# Patient Record
Sex: Male | Born: 1935 | Race: Black or African American | Hispanic: No
Health system: Southern US, Community
[De-identification: ages and names within clinical notes are randomized; demographics above are authoritative.]

---

## 2005-09-20 ENCOUNTER — Ambulatory Visit: Payer: Self-pay | Admitting: Family Medicine

## 2016-07-10 ENCOUNTER — Encounter (HOSPITAL_COMMUNITY): Payer: Self-pay | Admitting: Emergency Medicine

## 2016-07-10 ENCOUNTER — Emergency Department (HOSPITAL_COMMUNITY)
Admission: EM | Admit: 2016-07-10 | Discharge: 2016-07-10 | Disposition: A | Discharge status: Home / Self Care | Payer: Medicare Other | Attending: Emergency Medicine | Admitting: Emergency Medicine

## 2016-07-10 ENCOUNTER — Emergency Department (HOSPITAL_COMMUNITY): Payer: Medicare Other

## 2016-07-10 DIAGNOSIS — F0390 Unspecified dementia without behavioral disturbance: Secondary | ICD-10-CM | POA: Insufficient documentation

## 2016-07-10 DIAGNOSIS — R4182 Altered mental status, unspecified: Secondary | ICD-10-CM | POA: Diagnosis present

## 2016-07-10 LAB — COMPREHENSIVE METABOLIC PANEL
ALBUMIN: 4.5 g/dL (ref 3.5–5.0)
ALT: 15 U/L — AB (ref 17–63)
AST: 29 U/L (ref 15–41)
Alkaline Phosphatase: 50 U/L (ref 38–126)
Anion gap: 11 (ref 5–15)
BUN: 14 mg/dL (ref 6–20)
CHLORIDE: 99 mmol/L — AB (ref 101–111)
CO2: 26 mmol/L (ref 22–32)
CREATININE: 0.99 mg/dL (ref 0.61–1.24)
Calcium: 9.3 mg/dL (ref 8.9–10.3)
GFR calc Af Amer: >60 mL/min (ref >60)
GLUCOSE: 91 mg/dL (ref 65–99)
POTASSIUM: 3.9 mmol/L (ref 3.5–5.1)
Sodium: 136 mmol/L (ref 135–145)
Total Bilirubin: 1.9 mg/dL — ABNORMAL HIGH (ref 0.3–1.2)
Total Protein: 8 g/dL (ref 6.5–8.1)

## 2016-07-10 LAB — CBC WITH DIFFERENTIAL/PLATELET
BASOS ABS: 0 10*3/uL (ref 0.0–0.1)
BASOS PCT: 0 %
EOS PCT: 1 %
Eosinophils Absolute: 0 10*3/uL (ref 0.0–0.7)
HCT: 33.8 % — ABNORMAL LOW (ref 39.0–52.0)
Hemoglobin: 11.5 g/dL — ABNORMAL LOW (ref 13.0–17.0)
LYMPHS PCT: 20 %
Lymphs Abs: 0.9 10*3/uL (ref 0.7–4.0)
MCH: 30.7 pg (ref 26.0–34.0)
MCHC: 34 g/dL (ref 30.0–36.0)
MCV: 90.1 fL (ref 78.0–100.0)
Monocytes Absolute: 0.4 10*3/uL (ref 0.1–1.0)
Monocytes Relative: 9 %
NEUTROS ABS: 3 10*3/uL (ref 1.7–7.7)
Neutrophils Relative %: 70 %
PLATELETS: 316 10*3/uL (ref 150–400)
RBC: 3.75 MIL/uL — AB (ref 4.22–5.81)
RDW: 13.2 % (ref 11.5–15.5)
WBC: 4.2 10*3/uL (ref 4.0–10.5)

## 2016-07-10 LAB — URINALYSIS, ROUTINE W REFLEX MICROSCOPIC
BILIRUBIN URINE: NEGATIVE
Glucose, UA: NEGATIVE mg/dL
HGB URINE DIPSTICK: NEGATIVE
Ketones, ur: 5 mg/dL — AB
Leukocytes, UA: NEGATIVE
Nitrite: NEGATIVE
PH: 7 (ref 5.0–8.0)
Protein, ur: NEGATIVE mg/dL
SPECIFIC GRAVITY, URINE: 1.013 (ref 1.005–1.030)

## 2016-07-10 LAB — CBG MONITORING, ED: GLUCOSE-CAPILLARY: 85 mg/dL (ref 65–99)

## 2016-07-10 LAB — AMMONIA: Ammonia: 18 umol/L (ref 9–35)

## 2016-07-10 LAB — I-STAT CG4 LACTIC ACID, ED: Lactic Acid, Venous: 1.29 mmol/L (ref 0.5–1.9)

## 2016-07-10 LAB — I-STAT TROPONIN, ED: Troponin i, poc: 0 ng/mL (ref 0.00–0.08)
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
## 2016-07-10 NOTE — ED Notes (Signed)
Per [employer], patient truck is located at [facility] [employer].
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
## 2016-07-10 NOTE — ED Notes (Signed)
Per [employer], Pt reports that he has a son: [name] [phone].  However, they do not believe the number is working.

## 2019-04-07 ENCOUNTER — Other Ambulatory Visit: Payer: Self-pay

## 2019-04-07 ENCOUNTER — Emergency Department: Payer: Medicare Other

## 2019-04-07 ENCOUNTER — Emergency Department
Admission: EM | Admit: 2019-04-07 | Discharge: 2019-04-11 | Disposition: A | Discharge status: Home / Self Care | Payer: Medicare Other | Attending: Emergency Medicine | Admitting: Emergency Medicine

## 2019-04-07 DIAGNOSIS — S01111A Laceration without foreign body of right eyelid and periocular area, initial encounter: Secondary | ICD-10-CM | POA: Diagnosis not present

## 2019-04-07 DIAGNOSIS — Z23 Encounter for immunization: Secondary | ICD-10-CM | POA: Diagnosis not present

## 2019-04-07 DIAGNOSIS — E860 Dehydration: Secondary | ICD-10-CM | POA: Diagnosis not present

## 2019-04-07 DIAGNOSIS — W19XXXA Unspecified fall, initial encounter: Secondary | ICD-10-CM

## 2019-04-07 DIAGNOSIS — Y9389 Activity, other specified: Secondary | ICD-10-CM | POA: Insufficient documentation

## 2019-04-07 DIAGNOSIS — Y92017 Garden or yard in single-family (private) house as the place of occurrence of the external cause: Secondary | ICD-10-CM | POA: Insufficient documentation

## 2019-04-07 DIAGNOSIS — F0390 Unspecified dementia without behavioral disturbance: Secondary | ICD-10-CM | POA: Diagnosis not present

## 2019-04-07 DIAGNOSIS — Y998 Other external cause status: Secondary | ICD-10-CM | POA: Insufficient documentation

## 2019-04-07 DIAGNOSIS — S0990XA Unspecified injury of head, initial encounter: Secondary | ICD-10-CM | POA: Diagnosis present

## 2019-04-07 LAB — CBC WITH DIFFERENTIAL/PLATELET
Abs Immature Granulocytes: 0.03 10*3/uL (ref 0.00–0.07)
Basophils Absolute: 0 10*3/uL (ref 0.0–0.1)
Basophils Relative: 0 %
Eosinophils Absolute: 0 10*3/uL (ref 0.0–0.5)
Eosinophils Relative: 1 %
HCT: 32.6 % — ABNORMAL LOW (ref 39.0–52.0)
Hemoglobin: 10.7 g/dL — ABNORMAL LOW (ref 13.0–17.0)
Immature Granulocytes: 1 %
Lymphocytes Relative: 22 %
Lymphs Abs: 1.3 10*3/uL (ref 0.7–4.0)
MCH: 30.3 pg (ref 26.0–34.0)
MCHC: 32.8 g/dL (ref 30.0–36.0)
MCV: 92.4 fL (ref 80.0–100.0)
Monocytes Absolute: 0.6 10*3/uL (ref 0.1–1.0)
Monocytes Relative: 11 %
Neutro Abs: 3.9 10*3/uL (ref 1.7–7.7)
Neutrophils Relative %: 65 %
Platelets: 333 10*3/uL (ref 150–400)
RBC: 3.53 MIL/uL — ABNORMAL LOW (ref 4.22–5.81)
RDW: 14.2 % (ref 11.5–15.5)
WBC: 5.8 10*3/uL (ref 4.0–10.5)
nRBC: 0 % (ref 0.0–0.2)

## 2019-04-07 LAB — BASIC METABOLIC PANEL
Anion gap: 11 (ref 5–15)
Anion gap: 9 (ref 5–15)
BUN: 33 mg/dL — ABNORMAL HIGH (ref 8–23)
BUN: 32 mg/dL — ABNORMAL HIGH (ref 8–23)
CO2: 22 mmol/L (ref 22–32)
CO2: 23 mmol/L (ref 22–32)
Calcium: 9.4 mg/dL (ref 8.9–10.3)
Calcium: 9.3 mg/dL (ref 8.9–10.3)
Chloride: 104 mmol/L (ref 98–111)
Chloride: 107 mmol/L (ref 98–111)
Creatinine, Ser: 1.69 mg/dL — ABNORMAL HIGH (ref 0.61–1.24)
Creatinine, Ser: 1.56 mg/dL — ABNORMAL HIGH (ref 0.61–1.24)
GFR calc Af Amer: 43 mL/min — ABNORMAL LOW (ref >60)
GFR calc Af Amer: 47 mL/min — ABNORMAL LOW (ref >60)
GFR calc non Af Amer: 37 mL/min — ABNORMAL LOW (ref >60)
GFR calc non Af Amer: 40 mL/min — ABNORMAL LOW (ref >60)
Glucose, Bld: 133 mg/dL — ABNORMAL HIGH (ref 70–99)
Glucose, Bld: 122 mg/dL — ABNORMAL HIGH (ref 70–99)
Potassium: 5.2 mmol/L — ABNORMAL HIGH (ref 3.5–5.1)
Potassium: 4.9 mmol/L (ref 3.5–5.1)
Sodium: 137 mmol/L (ref 135–145)
Sodium: 139 mmol/L (ref 135–145)

## 2019-04-07 LAB — TROPONIN I (HIGH SENSITIVITY)
Troponin I (High Sensitivity): 6 ng/L (ref <18)
Troponin I (High Sensitivity): 6 ng/L (ref <18)

## 2019-04-07 MED ORDER — QUETIAPINE FUMARATE 25 MG PO TABS
50.0000 mg | ORAL_TABLET | Freq: Every day | ORAL | Status: DC
  Administered 2019-04-09 – 2019-04-10 (×2): 50 mg via ORAL
  Filled 2019-04-07 (×5): qty 2

## 2019-04-07 MED ORDER — TETANUS-DIPHTH-ACELL PERTUSSIS 5-2.5-18.5 LF-MCG/0.5 IM SUSP
0.5000 mL | Freq: Once | INTRAMUSCULAR | Status: AC
  Administered 2019-04-07: 0.5 mL via INTRAMUSCULAR
  Filled 2019-04-07: qty 0.5

## 2019-04-07 MED ORDER — SODIUM CHLORIDE 0.9 % IV BOLUS
1000.0000 mL | Freq: Once | INTRAVENOUS | Status: AC
Start: 2019-04-07 — End: 2019-04-07
  Administered 2019-04-07: 1000 mL via INTRAVENOUS

## 2019-04-07 MED ORDER — OLANZAPINE 10 MG IM SOLR
5.0000 mg | Freq: Once | INTRAMUSCULAR | Status: AC
  Administered 2019-04-07: 5 mg via INTRAMUSCULAR
  Filled 2019-04-07 (×2): qty 10

## 2019-04-07 NOTE — ED Notes (Signed)
Pt to see social work in the morning. NT to sit with pt d/t being a high fall risk and his AMS.

## 2019-04-07 NOTE — ED Notes (Signed)
This RN was called by Caitlyn, NT due to pt not attempting to leave his room. Pt told NT that he had to use the restroom but then when he got to the toilet said that he did not have to do anything. Pt then tried to leave the room but was stopped by NT. Pt then urinated on the floor and himself. Pt refused to change pants but allowed this RN to change his socks since they were wet. Pt placed back in bed with sitter at bedside. EDP made aware.

## 2019-04-07 NOTE — ED Notes (Signed)
Assisted Dr. Cinda Quest with applying dermabond to pt's R eyebrow
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
## 2019-04-07 NOTE — ED Notes (Signed)
Pt's son is at the bedside at this time, helping to redirect the patient to stay in the bed.

## 2019-04-07 NOTE — ED Notes (Signed)
Clean pt's wound on R eyebrow
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
## 2019-04-07 NOTE — ED Notes (Addendum)
This RN entered the pt's room, pt was standing up, pacing around the room. He pulled his IV out and his NS bolus was spilled onto the floor. Pt also disconnected his BP cuff, pulse ox, and cardiac leads. It appears that the pt urinated on hisself. Pt changed into clean blue paper scrubs and clean yellow socks. Pt was hard to redirect. Dr. Cinda Quest made aware. Dr. Cinda Quest instructed to straight stick for the lab work that was ordered.

## 2019-04-07 NOTE — ED Provider Notes (Signed)
Received patient signout from Dr. Rip Harbour.  Patient was pending psychiatry consult, nurse practitioner and psychiatry team have evaluated.  Patient became more agitated, urinating on floor, trying to get out of bed requiring staff frequent redirection and assistance back to bed.  Psychiatry team reevaluated, ordered medication for call me.  Psychiatry team plans to continue to follow patient into the morning as we also planned a social work consult.   Delman Kitten, MD 04/07/19 2248

## 2019-04-07 NOTE — ED Provider Notes (Signed)
Ongoing care including follow-up on psychiatry recommendations and reassessment as well as social work consult assigned to Dr. Beather Arbour.   Delman Kitten, MD 04/07/19 [phone]

## 2019-04-07 NOTE — Discharge Instructions (Addendum)
Please keep the cuts clean and dry.  Do not apply ointment on the eyebrow cut as that we will remove the skin glue that I used.  Please encourage plenty of liquids by mouth as he was a little dry today.  Please return for any further problems and follow-up with his doctor within the week to check and see how the cuts are doing.  Please return for any signs of infection including increased swelling, redness, pus or pain.
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
## 2019-04-07 NOTE — ED Notes (Signed)
Son states that pt may have fell due to his hip having trouble and "locking up on him" in the past.

## 2019-04-07 NOTE — ED Notes (Signed)
Pt has a son at bedside and the son is not assisting with the safety of the patient - he is allowing him to urinate in bed and on floor and ambulate around the room (unsteady gait) - Apolonio Schneiders RN went to room to answer call bell and found this things to be happening and attempted to change the pt/dry him - pt became agitated and aggressive with Apolonio Schneiders RN requiring assistance to change

## 2019-04-08 LAB — URINALYSIS, COMPLETE (UACMP) WITH MICROSCOPIC
Bacteria, UA: NONE SEEN
Bilirubin Urine: NEGATIVE
Glucose, UA: 50 mg/dL — AB
Hgb urine dipstick: NEGATIVE
Ketones, ur: NEGATIVE mg/dL
Leukocytes,Ua: NEGATIVE
Nitrite: NEGATIVE
Protein, ur: NEGATIVE mg/dL
Specific Gravity, Urine: 1.012 (ref 1.005–1.030)
WBC, UA: NONE SEEN WBC/hpf (ref 0–5)
pH: 6 (ref 5.0–8.0)

## 2019-04-08 MED ORDER — ZIPRASIDONE MESYLATE 20 MG IM SOLR
10.0000 mg | Freq: Once | INTRAMUSCULAR | Status: AC
  Administered 2019-04-08: 10 mg via INTRAMUSCULAR
  Filled 2019-04-08: qty 20

## 2019-04-08 NOTE — ED Notes (Signed)
Pt awake taken to bathroom. Pt helped back to bed. Pt appears to be more alert at this time answering simple questions. Pt eating meal tray.

## 2019-04-08 NOTE — TOC Initial Note (Signed)
Transition of Care [facility]) - Initial/Assessment Note    Patient Details  Name: Jeremy Downs MRN: [id number] Date of Birth: 1934-06-03  Transition of Care [place]) CM/SW Contact:    Fredric Mare, LCSW Phone Number: 04/08/2019, 3:52 PM  Clinical Narrative:                  Patient is an 85 year old male with dementia that presents to the ED for a fall. A bystander saw patient sitting on the porch of his home with an abrasion on his head.  Patient's daughter, Vaughan Basta, was at bedside. Vaughan Basta stated that she is considering either home health or long term care for this patient. CSW and Vaughan Basta discussed these options thoroughly. Vaughan Basta agreed to home health, and understood that a Education officer, museum can assist with transitioning to long term care and helping patient obtain special assistance Medicaid. CSW provided [place] with information for [facility].  Vaughan Basta chose [employer]. CSW contacted Sharmon Revere, and notified EDP to put in home health orders for PT, RN, SW, and nurse aide.      Expected Discharge Plan: [place]     Patient Goals and CMS Choice     Choice offered to / list presented to : [facility] POA / Guardian  Expected Discharge Plan and Services Expected Discharge Plan: [place]   Discharge Planning Services: CM Consult Post Acute Care Choice: [place] arrangements for the past 2 months: Single Family Home                           HH Arranged: PT, Social Work, Nurse's Aide, RN [place] Agency: [place] Date [place]: 04/08/19 Time HH Agency Contacted: 80 Representative spoke with at [place]: Sharmon Revere  Prior Living Arrangements/Services Living arrangements for the past 2 months: [place] with:: Siblings Patient language and need for interpreter reviewed:: Yes Do you feel safe going back to the place where you live?: Yes      Need for Family Participation in Patient Care: Yes (Comment) Care  giver support system in place?: Yes (comment)   Criminal Activity/Legal Involvement Pertinent to Current Situation/Hospitalization: No - Comment as needed  Activities of Daily Living      Permission Sought/Granted                  Emotional Assessment Appearance:: Appears stated age   Affect (typically observed): Calm, Appropriate Orientation: : Oriented to Self Alcohol / Substance Use: Never Used Psych Involvement: No (comment)  Admission diagnosis:  dementia Patient Active Problem List   Diagnosis Date Noted  . Dementia ([place]) 04/07/2019   PCP:  Patient, No Pcp Per Pharmacy:  No Pharmacies Listed    Social Determinants of Health (SDOH) Interventions    Readmission Risk Interventions No flowsheet data found.

## 2019-04-08 NOTE — ED Notes (Signed)
Pt given meal tray at this time. Pt ate about 50% of tray.

## 2019-04-08 NOTE — ED Notes (Signed)
Pt keeps trying to get up multiple times. Family and this EDT try to redirect him multiple times but not having a lot of success. Pt states he is going home.

## 2019-04-08 NOTE — ED Notes (Signed)
Breakfast tray placed in patient's room. Patient resting in bed with eyes closed, even, unlabored respirations. Safety sitter remains at bedside.

## 2019-04-08 NOTE — ED Notes (Signed)
Pt offered meal tray. Pt refused stating he was not hunger. Will offer again at a later time.

## 2019-04-08 NOTE — BH Assessment (Signed)
Writer called and left a HIPPA Compliant message with daughter Vaughan Basta Parker-[phone]), requesting a return phone call.

## 2019-04-08 NOTE — ED Notes (Signed)
Pt offered meal tray by family member. Pt ate 25% of tray.

## 2019-04-08 NOTE — Social Work (Signed)
CSW aware of consult; awaiting reassessment by psychiatrist.     Fredric Mare, [place] [facility]  [phone]

## 2019-04-08 NOTE — ED Notes (Signed)
Pt up at this time and helped to bathroom by this EDT. Bed linen changed and pt cleaned up from being wet. Pt helped back to bed.

## 2019-04-08 NOTE — ED Notes (Signed)
Family arrived to bedside

## 2019-04-08 NOTE — ED Provider Notes (Signed)
Procedures     ----------------------------------------- 6:44 PM on 04/08/2019 -----------------------------------------   SW has set up home health PT, HHA, RN, SW services.  Medically stable for DC home.    Carrie Mew, MD 04/08/19 [phone]

## 2019-04-08 NOTE — ED Notes (Signed)
Constant re-guidance of pt to lay down has been given. At this time pt is calm and resting. Linens changed. Pt cleaned. Brief, scrub pants and new non-slip socks placed on patient. This sitter remains at bedside.

## 2019-04-08 NOTE — ED Notes (Signed)
Pt has been getting out of bed, unable to redirect. Attempting to push staff out of the way and leave. Sitter remains at bedside. Nonskid socks remain in place

## 2019-04-08 NOTE — ED Notes (Signed)
Pt offered meal tray again continues to state he does not want it.

## 2019-04-08 NOTE — ED Notes (Signed)
Patient given lunch tray. Sitter remains at bedside with patient.
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
## 2019-04-08 NOTE — ED Provider Notes (Signed)
-----------------------------------------   6:11 AM on 04/08/2019 -----------------------------------------   Blood pressure (!) 165/96, pulse 70, temperature (!) 97.4 F (36.3 C), temperature source Oral, resp. rate 11, height 5\' 10"  (1.778 m), weight 55 kg, SpO2 100 %.  The patient is calm and cooperative at this time.  There have been no acute events since the last update.  Awaiting disposition plan from Behavioral Medicine and/or Social Work team(s).   Paulette Blanch, MD 04/08/19 [phone]

## 2019-04-09 MED ORDER — ENSURE ENLIVE PO LIQD
237.0000 mL | Freq: Two times a day (BID) | ORAL | Status: DC
  Administered 2019-04-10: 12:00:00 237 mL via ORAL

## 2019-04-09 MED ORDER — ZIPRASIDONE MESYLATE 20 MG IM SOLR
10.0000 mg | Freq: Once | INTRAMUSCULAR | Status: AC
  Administered 2019-04-09: 10 mg via INTRAMUSCULAR
  Filled 2019-04-09: qty 20

## 2019-04-09 MED ORDER — HALOPERIDOL LACTATE 5 MG/ML IJ SOLN: 5.0000 mg | Freq: Once | INTRAMUSCULAR | Status: DC

## 2019-04-09 NOTE — ED Provider Notes (Signed)
No acute issues during this shift.  Patient awaiting final disposition via social work team connecting with plans for home health care.  Ongoing care assigned to Dr. Rodena Piety, MD 04/09/19 [phone]

## 2019-04-09 NOTE — ED Notes (Signed)
Report from annie, rn. Sitter at bedside. resps unlabored. Pt resting in no acute distress.

## 2019-04-09 NOTE — ED Notes (Signed)
PT resting comfortably with even, snoring respirations

## 2019-04-09 NOTE — ED Provider Notes (Signed)
-----------------------------------------   6:28 AM on 04/09/2019 -----------------------------------------   Blood pressure (!) 151/74, pulse 76, temperature 98.1 F (36.7 C), temperature source Oral, resp. rate 18, height 5\' 10"  (1.778 m), weight 55 kg, SpO2 94 %.  The patient is calm and cooperative at this time.  Currently awaiting home health establishment for family to take the patient home.   Harvest Dark, MD 04/09/19 [phone]

## 2019-04-09 NOTE — ED Notes (Signed)
Pt provided dinner tray.  Family at bedside at this time.  Safety sitter remains at bedside.

## 2019-04-09 NOTE — ED Notes (Signed)
PT awake and trying to get out of bed again. Assisted tech to change pt's brief and place pt in new brief. PT back in bed.

## 2019-04-10 MED ORDER — DROPERIDOL 2.5 MG/ML IJ SOLN: 5.0000 mg | Freq: Once | INTRAMUSCULAR | Status: DC

## 2019-04-10 MED ORDER — DROPERIDOL 2.5 MG/ML IJ SOLN
2.5000 mg | Freq: Once | INTRAMUSCULAR | Status: AC
  Administered 2019-04-10: 03:00:00 2.5 mg via INTRAMUSCULAR
  Filled 2019-04-10: qty 2

## 2019-04-10 NOTE — ED Notes (Signed)
Pt calmer, sitter remains at bedside.

## 2019-04-10 NOTE — ED Notes (Signed)
Pt awake, sitter at bedside. Pt declines offer for po fluids or restroom.

## 2019-04-10 NOTE — ED Notes (Signed)
Pt. Woke up and tried to jump out of bed. Attempted to calm pt. Down and asked him if he needed to use the bathroom. Pt. Nodded but did nothing when sat on toilet. Pt. Then stood up and paced around the room, pt. Urinated on themselves. Cleaned pt. Up and asked if they wanted to go to bed or on the chair. Pt. Said "bed". Writer then directed pt. To the bed and pt. Is now calm and attempting to fall asleep once again. Will continue to monitor pt.

## 2019-04-10 NOTE — ED Notes (Signed)
Pts son at bedside with patient and sitter. Pt sitting in bed in NAD with evan and unlabored respirations. Son denies having further questions at this time.

## 2019-04-10 NOTE — ED Notes (Signed)
Pt sitting up on side of bed, sitter at side. resps unlabored.

## 2019-04-10 NOTE — ED Notes (Signed)
Pt will not stay in bed or chair for safety. Pt has not slept this pm and continues to refuse PO fluids. MD notified.

## 2019-04-10 NOTE — Social Work (Signed)
TOC CM/SW follow-up with family.   Social worker called daughter, Twanna Hy, [phone], No answer. Left HIPAA complaint message for Mrs. Parker to call [facility].   Berenice Bouton, MSW, LCSW  [phone] 8am-6pm (weekends) or CSW ED # [phone]

## 2019-04-10 NOTE — ED Notes (Signed)
Pt. Had been asleep since 0745, pt. Woke up asking to use the BR. Brought a urinal to pt., but pt. Had already urinated on themselves. Washed up pt., as well as changed linens and clothes. Pt. Is currently eating lunch. Will continue to monitor pt.

## 2019-04-10 NOTE — ED Notes (Signed)
Pt remains awake, talking to staff at bedside.

## 2019-04-10 NOTE — ED Notes (Signed)
Pt resting quietly on ER stretcher, pt has entire body covered with blanket.  When staff attempts to uncover head to check on pt, he immediately recovers head.  Safety sitter remains at bedside, breakfast tray left at bedside.  Will continue to monitor pt closely.

## 2019-04-10 NOTE — ED Notes (Signed)
Pt ambulatory in room, safety sitter within arms reach.  Pt unable to cooperate with VS at this time.  Dr. Jari Pigg aware of pt condition, PRN medication ordered if needed.  Will follow up with SW this AM to confirm POC.  Will continue to monitor.

## 2019-04-10 NOTE — ED Provider Notes (Signed)
-----------------------------------------   3:16 AM on 04/10/2019 -----------------------------------------   Blood pressure (!) 125/92, pulse 66, temperature (!) 97.5 F (36.4 C), temperature source Axillary, resp. rate 16, height 1.778 m (5\' 10" ), weight 55 kg, SpO2 100 %.  The patient is unable to sleep, trying to get up frequently, requires frequent redirection by his sitter.  He seems to be suffering from insomnia as well as some sundowning and he is a fall risk.  To help him with his level of comfort I am ordering as a calming agent a dose of droperidol 2.5 mg intramuscular.  The patient does not tolerate having an IV for an extended period of time and he should tolerate intramuscular well.  He has not been eating or drinking very much so if he is able to get some rest with the intramuscular droperidol, we may consider placing an IV and giving him a small fluid bolus but will reassess once he is more calm.   ----------------------------------------- 6:34 AM on 04/10/2019 -----------------------------------------  Patient is still agitated but more redirectable than before.  No acute distress.  Awaiting placement.   Hinda Kehr, MD 04/10/19 [phone]

## 2019-04-10 NOTE — ED Notes (Signed)
Sitter up out of bed constantly, ambulating around room. Sitter remains at side to prevent fall.

## 2019-04-10 NOTE — ED Notes (Signed)
Report to jennifer, rn.  

## 2019-04-10 NOTE — ED Notes (Signed)
Pt sitting in bedside chair folding bed pad. Pt In NAD and calm and cooperative with staff.

## 2019-04-10 NOTE — ED Notes (Signed)
Candida Peeling - Niece - [phone] Called to check on patient. She has verbalized concern for home health and questioned the safety of pt returning home. She is hoping to talk to someone about placing pt in an assisted living. Message left for social work and RN has encouraged Anne Ng to talk to her family in regards to their wishes. Anne Ng also reported this this RN that she questions whether decisions have been made by her family to keep patient at home because of safety or because of pts "checks."

## 2019-04-10 NOTE — ED Notes (Signed)
Pt up out of bed, urinating on floor and wall. Pt assisted with cleansing, new pants provided, new socks provided.

## 2019-04-10 NOTE — ED Notes (Signed)
Pt remains awake. Sitter at side. resps unlabored. Pt refusing to wear underpants, but pants in place.

## 2019-04-10 NOTE — ED Notes (Signed)
Pt less agitated, sitter remains at bedside. Bed in low and locked position, siderails up.

## 2019-04-10 NOTE — ED Notes (Signed)
Pt up to commode, small urine void.

## 2019-04-10 NOTE — ED Notes (Signed)
This RN into room to relieve sitter for break.

## 2019-04-10 NOTE — ED Notes (Signed)
Cannot keep pt still long enough to obtain a blood pressure or pox. Manual hr 100. Pt eating ice cream with assist of nurse.

## 2019-04-10 NOTE — ED Notes (Signed)
Pt ate one cup of vanilla ice cream with assist to eat. Pt placed in paper pants for comfort, will not wear brief or gown/shirt. Sitter back in room from break.

## 2019-04-11 NOTE — ED Notes (Signed)
Pt sitting up in bed, sitter at bedside. No distress noted.

## 2019-04-11 NOTE — ED Notes (Signed)
Per sitter pt with one incidence of urination in commode this am.

## 2019-04-11 NOTE — Social Work (Addendum)
TOC CM/SW made contact with the patient's daughter, Twanna Hy, [phone].  Mrs. Jerline Pain stated that she is going to pick-up the patient today Sunday, 04/11/2019 at 11:00 AM. Has HH in place and DME. Stated that she has been in contact with [facility] for long term care.  [facility] assisting family with medicaid applications and necessary paper work that is needed for placement and payments.   Plan: Discharge home with family and [place] on 04/11/2019.    Berenice Bouton, MSW, LCSW  [phone] 8am-6pm (weekends) or CSW ED # [phone]

## 2019-04-11 NOTE — ED Notes (Signed)
Pt sitting up on side of bed, sitter at bedside. Pt has been independently ambulating tonight with sitter within arms reach.

## 2019-04-11 NOTE — ED Notes (Signed)
Pt sleeping, sitter at bedside

## 2019-04-11 NOTE — ED Notes (Signed)
Pt remains awake, sitting in chair with sitter at side.

## 2019-04-11 NOTE — ED Provider Notes (Signed)
-----------------------------------------   7:06 AM on 04/11/2019 -----------------------------------------   Blood pressure (P) 120/73, pulse 100, temperature 98.2 F (36.8 C), temperature source Oral, resp. rate 20, height 5\' 10"  (1.778 m), weight 55 kg, SpO2 100 %.  The patient is calm and cooperative at this time.  There have been no acute events since the last update.  Awaiting disposition plan from Social Work team(s).   Arta Silence, MD 04/11/19 [phone]

## 2019-04-11 NOTE — ED Notes (Addendum)
Pt discharged with son. Pt's son verbalized understanding of discharge instructions and signed signature pad acknowledging understanding. Pt in NAD at this time.

## 2019-04-11 NOTE — ED Notes (Signed)
Pt awake, sitting up on side of bed with sitter at bedside.

## 2019-04-11 NOTE — ED Notes (Signed)
Report to kim, rn.  

## 2019-04-11 NOTE — ED Notes (Signed)
Pt consumed one cup of ice cream with sips of juice.

## 2019-04-11 NOTE — ED Provider Notes (Signed)
-----------------------------------------   10:46 AM on 04/11/2019 -----------------------------------------  Per social work, the family will pick up the patient today around 69.  The patient is stable for discharge home.  Return precautions and discharge instructions provided.   Arta Silence, MD 04/11/19 1046

## 2019-04-11 NOTE — ED Notes (Signed)
Pt resting in bed, sitter at side.

## 2019-04-11 NOTE — ED Notes (Signed)
Pt sleeping. 

## 2019-12-07 DEATH — deceased

## 2020-02-23 IMAGING — CT CT CERVICAL SPINE W/O CM
3 of 4 series · 12 of 33 positions shown, 14 images · non-contrast
Comparison: None.

CLINICAL DATA: Neck pain. Status post fall with a head contusion.
Initial encounter.

EXAM:
CT CERVICAL SPINE WITHOUT CONTRAST
TECHNIQUE: Multidetector CT imaging of the cervical spine was performed without
intravenous contrast. Multiplanar CT image reconstructions were also
generated.

[Series 4: sagittal bone · sagittal · 0.30mm/px · 5 of 85 slices shown, 6 images]
[im 29/85  bone]
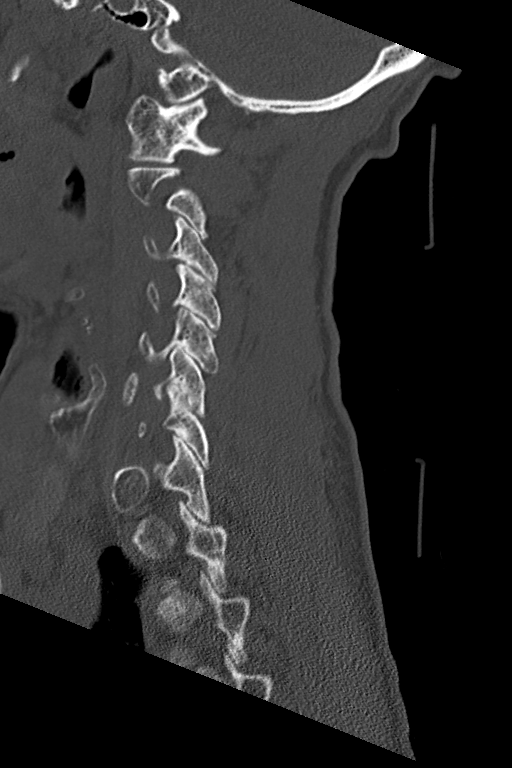
[im 36/85  bone]
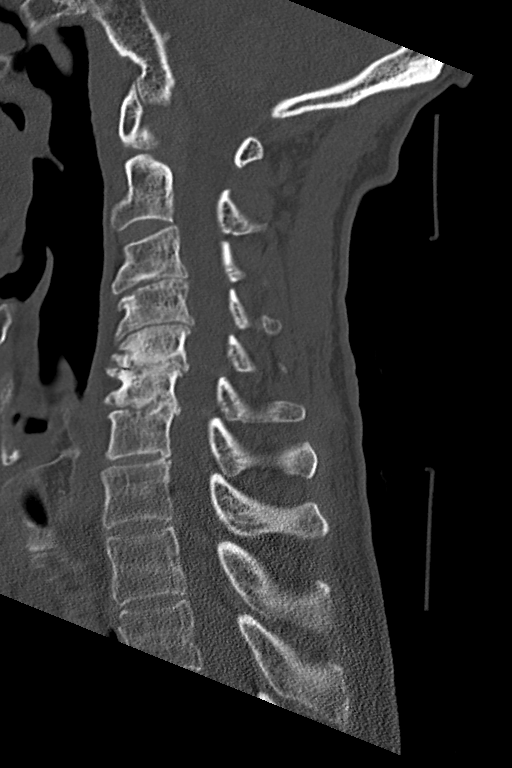
[im 43/85  soft-tissue]
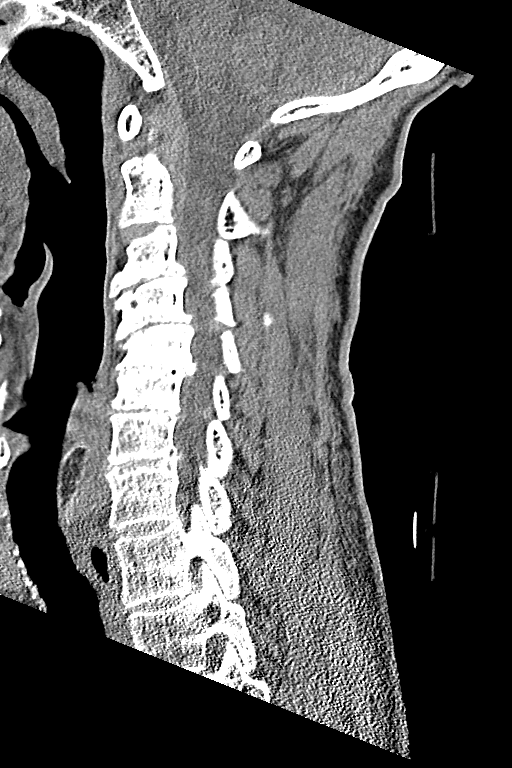
[im 43/85  bone]
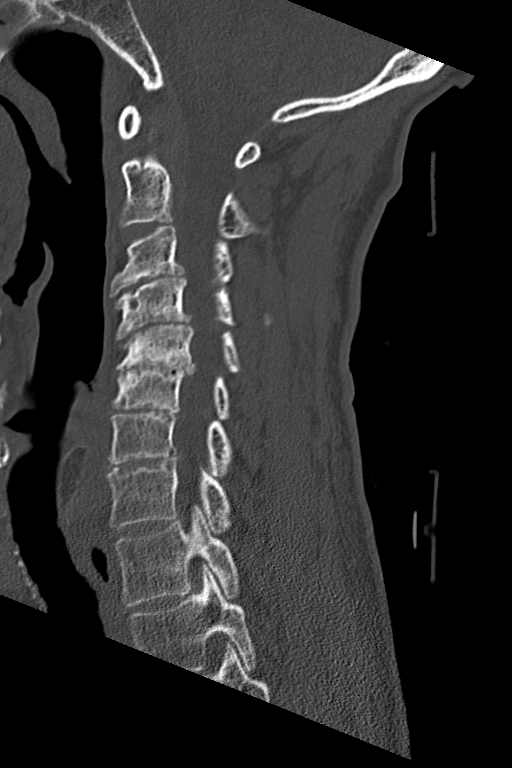
[im 50/85  bone]
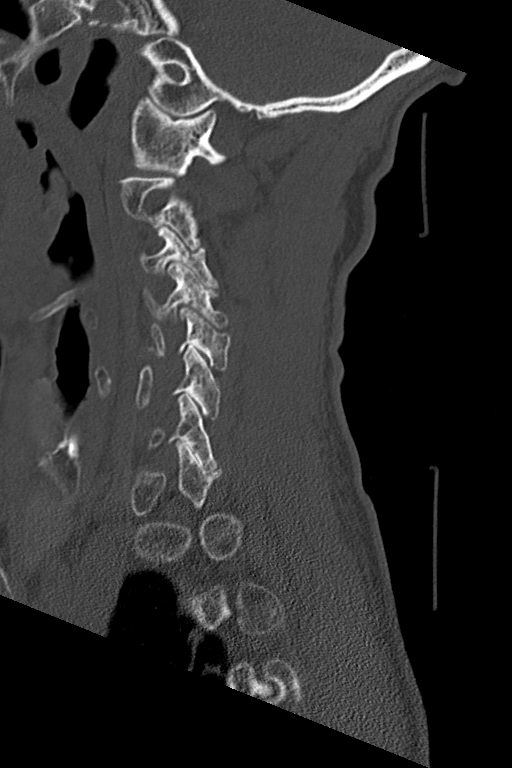
[im 57/85  bone]
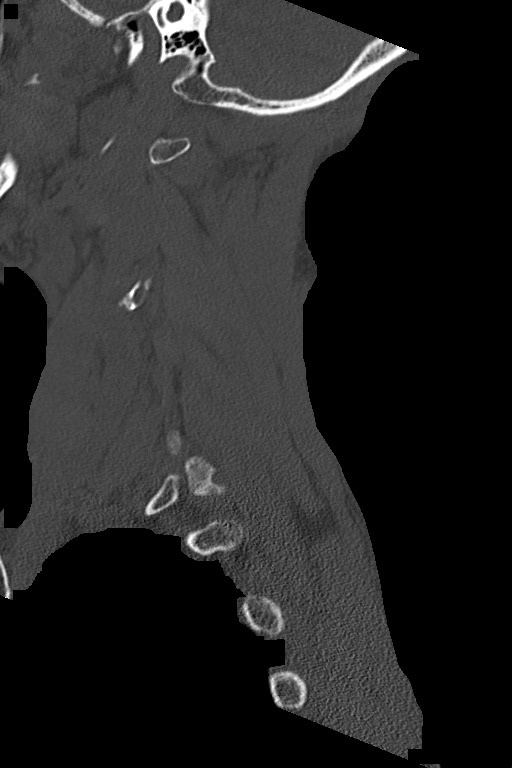

[Series 5: coronal bone · coronal · 0.27mm/px · 3 of 62 slices shown]
[im 13/62  bone]
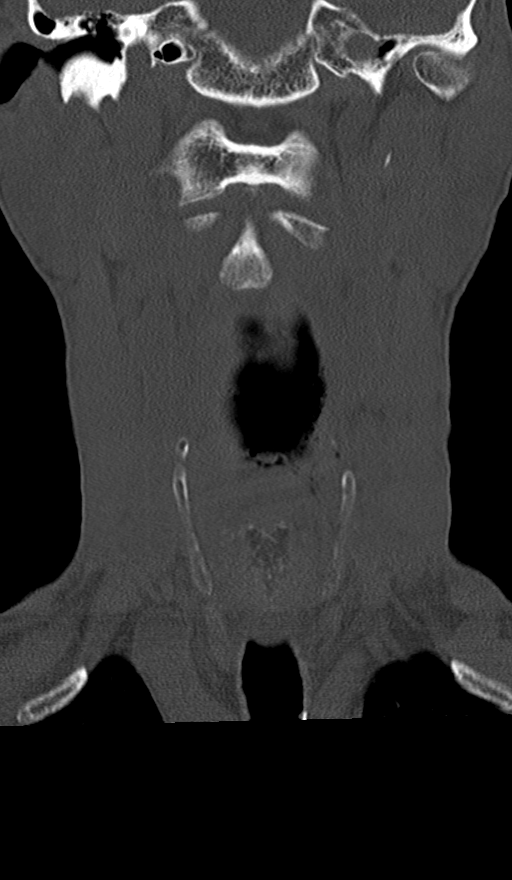
[im 25/62  bone]
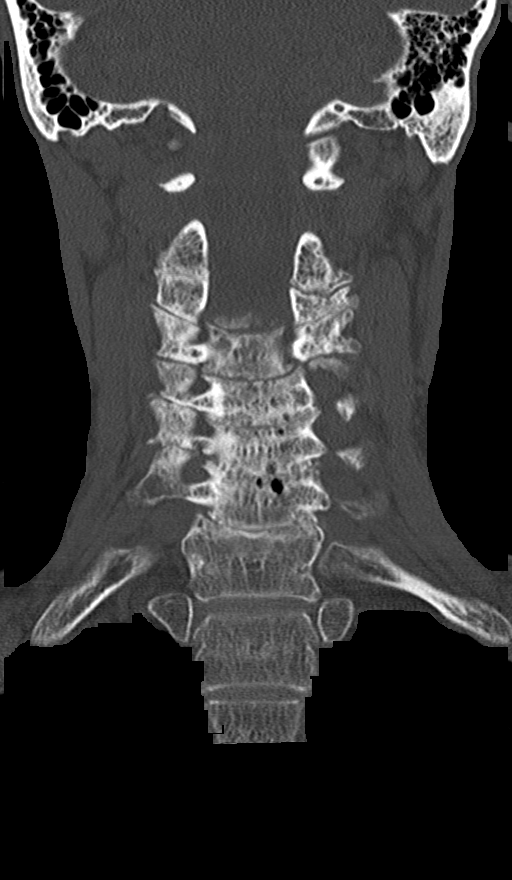
[im 37/62  bone]
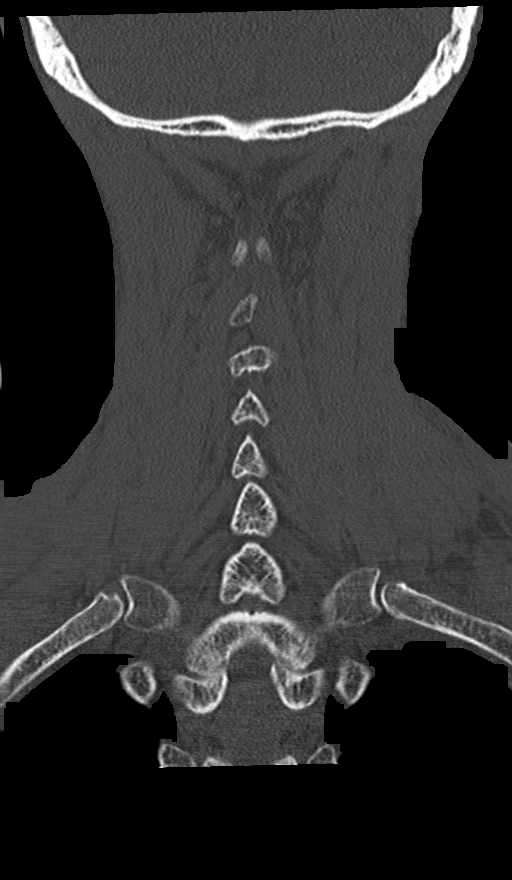

[Series 6: orthogonal bone · axial · 0.27mm/px · z∈[-341,-184]mm · 4 of 117 slices shown, 5 images]
[im 17/117  soft-tissue]
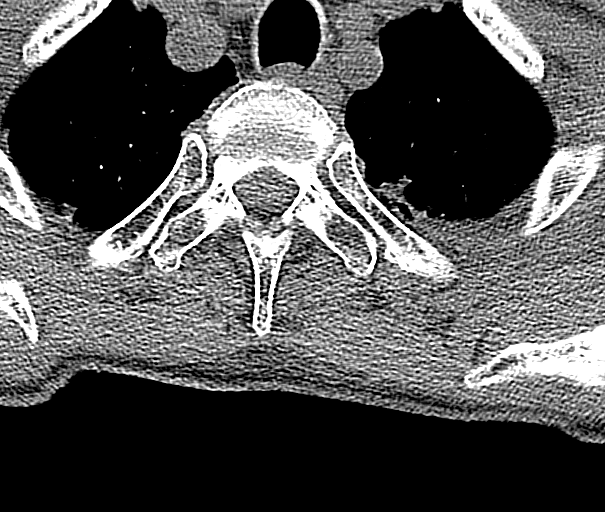
[im 17/117  bone]
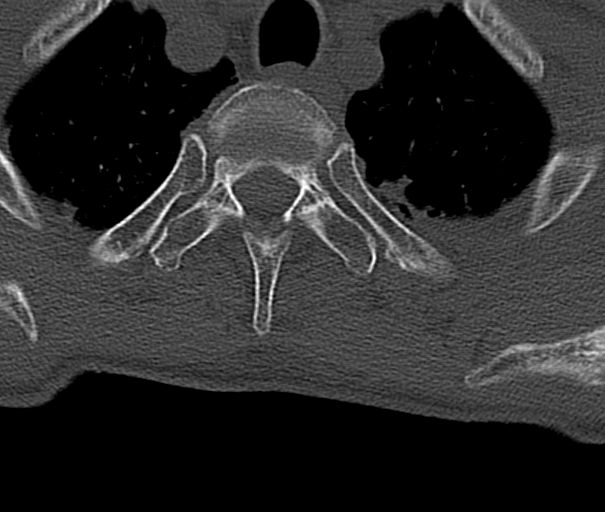
[im 50/117  bone]
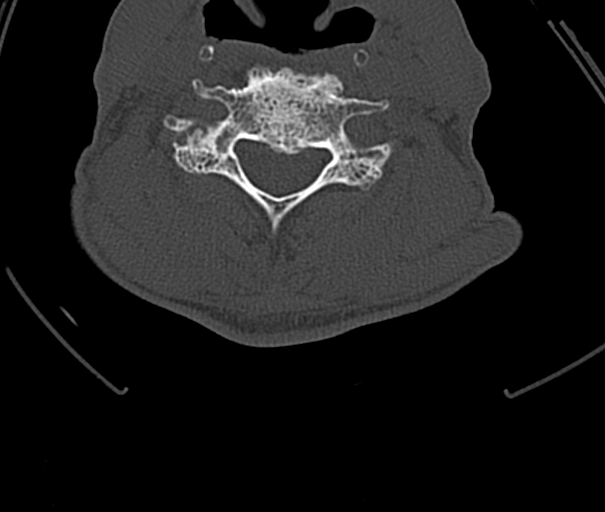
[im 67/117  bone]
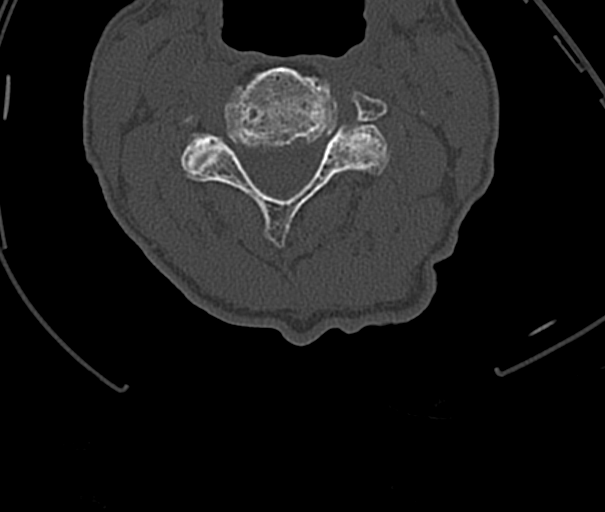
[im 100/117  bone]
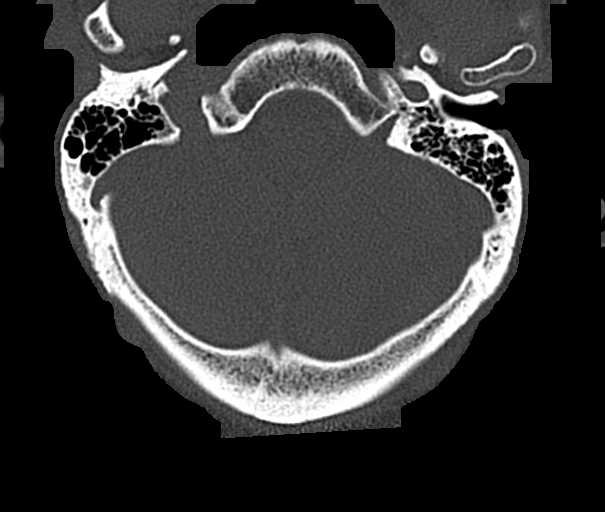

[12 of 33 positions shown; findings below may reference images not displayed]

FINDINGS: Alignment: No listhesis. Reversal the normal cervical lordosis
noted.

Skull base and vertebrae: No acute fracture. No primary bone lesion
or focal pathologic process. Marked degenerative endplate sclerosis
is worst at C5-6.

Soft tissues and spinal canal: No prevertebral fluid or swelling. No
visible canal hematoma.

Disc levels: Severe loss of disc space height is present from C3-C7.

Upper chest: Biapical scar noted.

Other: None.
IMPRESSION: No acute abnormality.

Advanced multilevel degenerative disc disease. Reversal of the
normal cervical lordosis also noted.
